# Patient Record
Sex: Male | Born: 1995 | Race: White | Hispanic: No | Marital: Single | Smoking: Never smoker
Health system: Southern US, Community
[De-identification: ages and names within clinical notes are randomized; demographics above are authoritative.]

---
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
## 2018-06-17 ENCOUNTER — Other Ambulatory Visit: Payer: Self-pay | Admitting: Family Medicine

## 2018-06-17 DIAGNOSIS — R7989 Other specified abnormal findings of blood chemistry: Secondary | ICD-10-CM

## 2018-06-24 ENCOUNTER — Ambulatory Visit
Admission: RE | Admit: 2018-06-24 | Discharge: 2018-06-24 | Disposition: A | Discharge status: Home / Self Care | Payer: Managed Care, Other (non HMO) | Source: Ambulatory Visit | Attending: Family Medicine | Admitting: Family Medicine

## 2018-06-24 DIAGNOSIS — R7989 Other specified abnormal findings of blood chemistry: Secondary | ICD-10-CM

## 2019-03-26 ENCOUNTER — Other Ambulatory Visit: Payer: Self-pay

## 2019-03-26 ENCOUNTER — Encounter (HOSPITAL_COMMUNITY): Payer: Self-pay | Admitting: Emergency Medicine

## 2019-03-26 ENCOUNTER — Emergency Department (HOSPITAL_COMMUNITY): Payer: Managed Care, Other (non HMO)

## 2019-03-26 ENCOUNTER — Emergency Department (HOSPITAL_COMMUNITY)
Admission: EM | Admit: 2019-03-26 | Discharge: 2019-03-26 | Disposition: A | Discharge status: Home / Self Care | Payer: Managed Care, Other (non HMO) | Attending: Emergency Medicine | Admitting: Emergency Medicine

## 2019-03-26 DIAGNOSIS — Z79899 Other long term (current) drug therapy: Secondary | ICD-10-CM | POA: Insufficient documentation

## 2019-03-26 DIAGNOSIS — R569 Unspecified convulsions: Secondary | ICD-10-CM | POA: Diagnosis not present

## 2019-03-26 DIAGNOSIS — F909 Attention-deficit hyperactivity disorder, unspecified type: Secondary | ICD-10-CM | POA: Diagnosis not present

## 2019-03-26 LAB — CBC WITH DIFFERENTIAL/PLATELET
Abs Immature Granulocytes: 0.04 10*3/uL (ref 0.00–0.07)
Basophils Absolute: 0.1 10*3/uL (ref 0.0–0.1)
Basophils Relative: 1 %
Eosinophils Absolute: 0.1 10*3/uL (ref 0.0–0.5)
Eosinophils Relative: 1 %
HCT: 46.4 % (ref 39.0–52.0)
Hemoglobin: 15.4 g/dL (ref 13.0–17.0)
Immature Granulocytes: 1 %
Lymphocytes Relative: 12 %
Lymphs Abs: 1 10*3/uL (ref 0.7–4.0)
MCH: 31.2 pg (ref 26.0–34.0)
MCHC: 33.2 g/dL (ref 30.0–36.0)
MCV: 93.9 fL (ref 80.0–100.0)
Monocytes Absolute: 0.7 10*3/uL (ref 0.1–1.0)
Monocytes Relative: 8 %
Neutro Abs: 6.5 10*3/uL (ref 1.7–7.7)
Neutrophils Relative %: 77 %
Platelets: 267 10*3/uL (ref 150–400)
RBC: 4.94 MIL/uL (ref 4.22–5.81)
RDW: 12.1 % (ref 11.5–15.5)
WBC: 8.4 10*3/uL (ref 4.0–10.5)
nRBC: 0 % (ref 0.0–0.2)

## 2019-03-26 LAB — RAPID URINE DRUG SCREEN, HOSP PERFORMED
Amphetamines: NOT DETECTED
Barbiturates: NOT DETECTED
Benzodiazepines: NOT DETECTED
Cocaine: NOT DETECTED
Opiates: NOT DETECTED
Tetrahydrocannabinol: POSITIVE — AB

## 2019-03-26 LAB — BASIC METABOLIC PANEL
Anion gap: 15 (ref 5–15)
BUN: 7 mg/dL (ref 6–20)
CO2: 17 mmol/L — ABNORMAL LOW (ref 22–32)
Calcium: 10 mg/dL (ref 8.9–10.3)
Chloride: 108 mmol/L (ref 98–111)
Creatinine, Ser: 1.02 mg/dL (ref 0.61–1.24)
GFR calc Af Amer: >60 mL/min (ref >60)
GFR calc non Af Amer: >60 mL/min (ref >60)
Glucose, Bld: 90 mg/dL (ref 70–99)
Potassium: 4.2 mmol/L (ref 3.5–5.1)
Sodium: 140 mmol/L (ref 135–145)

## 2019-03-26 LAB — ETHANOL: Alcohol, Ethyl (B): <10 mg/dL (ref <10)

## 2019-03-26 LAB — CBG MONITORING, ED: Glucose-Capillary: 81 mg/dL (ref 70–99)

## 2019-03-26 MED ORDER — SODIUM CHLORIDE 0.9 % IV BOLUS
1000.0000 mL | Freq: Once | INTRAVENOUS | Status: AC
  Administered 2019-03-26: 1000 mL via INTRAVENOUS
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
## 2019-03-26 NOTE — ED Notes (Signed)
Discharge instructions discussed with Pt. Pt verbalized understanding. Pt stable and ambulatory.    

## 2019-03-26 NOTE — ED Provider Notes (Signed)
Medical screening examination/treatment/procedure(s) were conducted as a shared visit with non-physician practitioner(s) and myself.  I personally evaluated the patient during the encounter.  EKG Interpretation  Date/Time:  Wednesday March 26 2019 18:01:56 EDT Ventricular Rate:  75 PR Interval:    QRS Duration: 97 QT Interval:  386 QTC Calculation: 432 R Axis:   89 Text Interpretation:  Sinus rhythm RSR' in V1 or V2, probably normal variant ST elev, probable normal early repol pattern Confirmed by Lorre Nick ([id number]) on 03/26/2019 6:54:35 PM   23 year old male with no prior history of seizures presents after having a witnessed seizure with postictal activity.  Exam is nonfocal here.  Will check head CT and labs.   Lorre Nick, MD 03/26/19 [phone]
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
## 2019-03-26 NOTE — ED Notes (Signed)
Pt's sister Johnson Nellessen [phone]. Pls contact with status update and D/C

## 2019-03-26 NOTE — Discharge Instructions (Signed)
Your work-up today was reassuring.  Your head CT was normal and your lab work was also normal.  Follow-up with neurology in the next 1 to 2 weeks for evaluation of your seizure-like activity.  Avoid alcohol or drug use as this could lower the seizure threshold.  Avoid any activities that could be considered dangerous if you were to have another seizure such as swimming, driving, holding infants, rockclimbing, etc.  Return to the emergency department immediately if any concerning signs or symptoms develop such as further seizure-like activity, severe headaches, high fevers, weakness to one side of the body, facial droop, etc.

## 2019-03-26 NOTE — ED Notes (Signed)
Pt given urinal and aware a urine sample is needed.  

## 2020-01-28 ENCOUNTER — Emergency Department (HOSPITAL_COMMUNITY): Payer: BC Managed Care – PPO

## 2020-01-28 ENCOUNTER — Emergency Department (HOSPITAL_COMMUNITY)
Admission: EM | Admit: 2020-01-28 | Discharge: 2020-01-28 | Disposition: A | Discharge status: Home / Self Care | Payer: BC Managed Care – PPO | Attending: Emergency Medicine | Admitting: Emergency Medicine

## 2020-01-28 DIAGNOSIS — T40601A Poisoning by unspecified narcotics, accidental (unintentional), initial encounter: Secondary | ICD-10-CM | POA: Insufficient documentation

## 2020-01-28 DIAGNOSIS — Z79899 Other long term (current) drug therapy: Secondary | ICD-10-CM | POA: Insufficient documentation
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
## 2020-01-28 NOTE — Discharge Instructions (Addendum)
You were evaluated in the emergency department for a likely opiate overdose.  You were observed for period of time and did not show any signs of becoming more sedated again.  Your EKG and chest x-ray did not show any serious findings.  Please consider getting help with your drug use.  Return to the emergency department if any worsening symptoms.

## 2020-01-28 NOTE — ED Provider Notes (Signed)
[facility] EMERGENCY DEPARTMENT Provider Note   CSN: [id number] Arrival date & time: 01/28/20  1801     History Chief Complaint  Patient presents with  . Drug Overdose    Mario Leonard is a 24 y.o. male.  History given by EMS as the patient insufflated some medication and became unresponsive.  Received 7 minutes of bystander CPR.  Initial EMS found him to be in respiratory arrest and gave Narcan with improvement in respiratory status along with mental status.  Patient arrives here awake and alert denying any complaints.  Denies this was an intentional overdose.  He tells me he bought some antianxiety medicine through the black market and insufflated this medication and became unresponsive.  Denies prior OD.  The history is provided by the patient and the EMS personnel.  Drug Overdose This is a new problem. The current episode started less than 1 hour ago. The problem occurs rarely. The problem has been resolved. Pertinent negatives include no chest pain, no abdominal pain, no headaches and no shortness of breath. Nothing aggravates the symptoms. Relieved by: narcan, CPR. The treatment provided significant relief.       History reviewed. No pertinent past medical history.  There are no problems to display for this patient.   No past surgical history on file.     No family history on file.  Social History   Tobacco Use  . Smoking status: Never Smoker  . Smokeless tobacco: Never Used  Substance Use Topics  . Alcohol use: Not Currently  . Drug use: Not Currently    Types: Marijuana    Comment: 1 month ago     Home Medications Prior to Admission medications   Medication Sig Start Date End Date Taking? Authorizing Provider  dexmethylphenidate (FOCALIN XR) 5 MG 24 hr capsule Take 5 mg by mouth daily. 11/25/18   [provider]  dexmethylphenidate (FOCALIN) 2.5 MG tablet Take 2.5 mg by mouth 2 (two) times daily. 05/31/19   [provider]     Allergies    Patient has no known allergies.  Review of Systems   Review of Systems  Constitutional: Negative for fever.  HENT: Negative for sore throat.   Eyes: Negative for visual disturbance.  Respiratory: Negative for shortness of breath.   Cardiovascular: Negative for chest pain.  Gastrointestinal: Negative for abdominal pain.  Genitourinary: Negative for dysuria.  Musculoskeletal: Negative for neck pain.  Skin: Negative for rash.  Neurological: Negative for headaches.    Physical Exam Updated Vital Signs BP 135/81 (BP Location: Right Arm)   Pulse 84   Temp (!) 97.5 F (36.4 C) (Oral)   Resp 12   SpO2 100%   Physical Exam Vitals and nursing note reviewed.  Constitutional:      Appearance: He is well-developed.  HENT:     Head: Normocephalic and atraumatic.  Eyes:     Extraocular Movements: Extraocular movements intact.     Conjunctiva/sclera: Conjunctivae normal.     Pupils: Pupils are equal, round, and reactive to light.  Cardiovascular:     Rate and Rhythm: Normal rate and regular rhythm.     Pulses: Normal pulses.     Heart sounds: No murmur.  Pulmonary:     Effort: Pulmonary effort is normal. No respiratory distress.     Breath sounds: Normal breath sounds.  Abdominal:     Palpations: Abdomen is soft.     Tenderness: There is no abdominal tenderness.  Musculoskeletal:  General: No deformity or signs of injury. Normal range of motion.     Cervical back: Neck supple.  Skin:    General: Skin is warm and dry.     Capillary Refill: Capillary refill takes less than 2 seconds.  Neurological:     General: No focal deficit present.     Mental Status: He is alert.     Sensory: No sensory deficit.     Motor: No weakness.     ED Results / Procedures / Treatments   Labs (all labs ordered are listed, but only abnormal results are displayed) Labs Reviewed - No data to display  EKG EKG Interpretation  Date/Time:  Wednesday January 28 2020  18:08:13 EST Ventricular Rate:  87 PR Interval:    QRS Duration: 101 QT Interval:  361 QTC Calculation: 435 R Axis:   89 Text Interpretation: Sinus rhythm RSR' in V1 or V2, probably normal variant ST elev, probable normal early repol pattern similar to prior 4/20 Confirmed by Meridee Score [phone]) on 01/28/2020 6:14:04 PM   Radiology DG Chest Port 1 View  Result Date: 01/28/2020 CLINICAL DATA:  Post CPR. EXAM: PORTABLE CHEST 1 VIEW COMPARISON:  None. FINDINGS: The heart size and mediastinal contours are within normal limits. Both lungs are clear. The visualized skeletal structures are unremarkable. IMPRESSION: No active disease. Electronically Signed   By: Katherine Mantle M.D.   On: 01/28/2020 19:00    Procedures Procedures (including critical care time)  Medications Ordered in ED Medications - No data to display  ED Course  I have reviewed the triage vital signs and the nursing notes.  Pertinent labs & imaging results that were available during my care of the patient were reviewed by me and considered in my medical decision making (see chart for details).  Clinical Course as of Jan 28 1143  Wed Jan 28, 2020  1812 Differential includes narcotic overdose, arrhythmia, hypoglycemia, pneumothorax, rib fractures   [MB]  1846 Chest x-ray interpreted by me as no gross infiltrates no pneumothorax.  Awaiting radiology reading.   [MB]  1926 Reevaluation-patient remains awake and alert texting on his phone.   [MB]    Clinical Course User Index [MB] Terrilee Files, MD   MDM Rules/Calculators/A&P                       Final Clinical Impression(s) / ED Diagnoses Final diagnoses:  Opiate overdose, accidental or unintentional, initial encounter [facility])    Rx / DC Orders ED Discharge Orders    None       Terrilee Files, MD 01/29/20 1144

## 2020-01-28 NOTE — ED Notes (Signed)
Pt discharge and follow up education provided. Pt verbalizes understanding. Pt is alert and oriented x 4 and ambulatory at discharge.  

## 2020-01-29 ENCOUNTER — Encounter (HOSPITAL_COMMUNITY): Payer: Self-pay | Admitting: Emergency Medicine
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
## 2021-05-18 IMAGING — DX DG CHEST 1V PORT
1 series · 1 of 1 positions shown · non-contrast
Comparison: None.

CLINICAL DATA: Post CPR.

EXAM:
PORTABLE CHEST 1 VIEW

[chest]
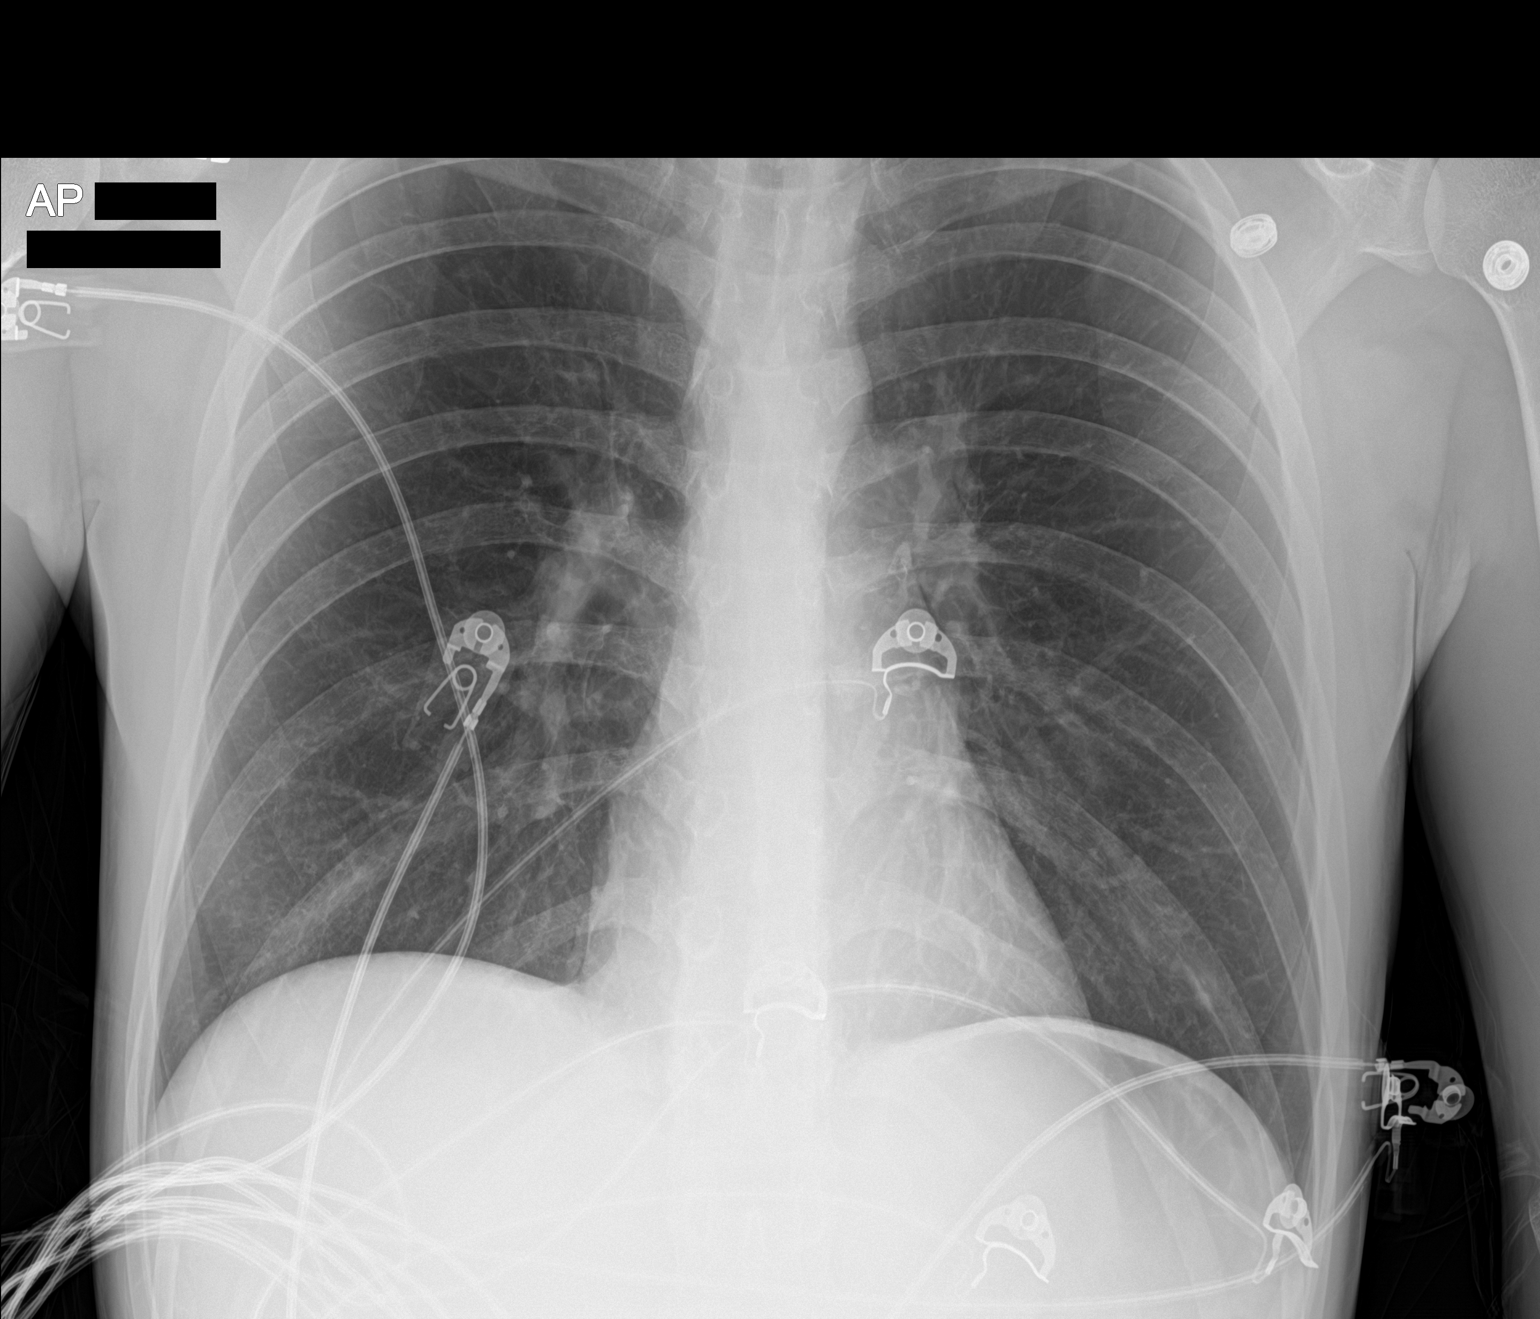

[1 of 1 positions shown; findings below may reference images not displayed]

FINDINGS: The heart size and mediastinal contours are within normal limits.
Both lungs are clear. The visualized skeletal structures are
unremarkable.
IMPRESSION: No active disease.
